# Patient Record
Sex: Male | Born: 1970 | Race: White | Hispanic: No | Marital: Married | Smoking: Current every day smoker
Health system: Southern US, Community
[De-identification: ages and names within clinical notes are randomized; demographics above are authoritative.]

## PROBLEM LIST (undated history)

## (undated) DIAGNOSIS — G4733 Obstructive sleep apnea (adult) (pediatric): Secondary | ICD-10-CM

## (undated) DIAGNOSIS — J449 Chronic obstructive pulmonary disease, unspecified: Secondary | ICD-10-CM

## (undated) DIAGNOSIS — E119 Type 2 diabetes mellitus without complications: Secondary | ICD-10-CM

## (undated) DIAGNOSIS — I10 Essential (primary) hypertension: Secondary | ICD-10-CM

## (undated) DIAGNOSIS — Z720 Tobacco use: Secondary | ICD-10-CM

## (undated) DIAGNOSIS — K7460 Unspecified cirrhosis of liver: Secondary | ICD-10-CM

## (undated) DIAGNOSIS — E7800 Pure hypercholesterolemia, unspecified: Secondary | ICD-10-CM

## (undated) DIAGNOSIS — I2510 Atherosclerotic heart disease of native coronary artery without angina pectoris: Secondary | ICD-10-CM

## (undated) HISTORY — PX: NOSE SURGERY: SHX723

## (undated) HISTORY — PX: KNEE SURGERY: SHX244

## (undated) HISTORY — PX: MANDIBLE SURGERY: SHX707

---

## 2010-07-18 ENCOUNTER — Emergency Department (HOSPITAL_COMMUNITY)
Admission: EM | Admit: 2010-07-18 | Discharge: 2010-07-18 | Disposition: A | Discharge status: Home / Self Care | Payer: No Typology Code available for payment source | Attending: Emergency Medicine | Admitting: Emergency Medicine

## 2010-07-18 DIAGNOSIS — Y92410 Unspecified street and highway as the place of occurrence of the external cause: Secondary | ICD-10-CM | POA: Insufficient documentation

## 2010-07-18 DIAGNOSIS — S335XXA Sprain of ligaments of lumbar spine, initial encounter: Secondary | ICD-10-CM | POA: Insufficient documentation

## 2010-07-18 DIAGNOSIS — IMO0002 Reserved for concepts with insufficient information to code with codable children: Secondary | ICD-10-CM | POA: Insufficient documentation

## 2010-07-18 DIAGNOSIS — E119 Type 2 diabetes mellitus without complications: Secondary | ICD-10-CM | POA: Insufficient documentation

## 2010-07-18 DIAGNOSIS — I10 Essential (primary) hypertension: Secondary | ICD-10-CM | POA: Insufficient documentation

## 2011-10-15 ENCOUNTER — Emergency Department: Payer: Self-pay | Admitting: *Deleted

## 2011-10-15 LAB — CBC
HCT: 45.4 % (ref 40.0–52.0)
MCH: 31.3 pg (ref 26.0–34.0)
MCHC: 34.1 g/dL (ref 32.0–36.0)
MCV: 92 fL (ref 80–100)
Platelet: 140 10*3/uL — ABNORMAL LOW (ref 150–440)
RBC: 4.95 10*6/uL (ref 4.40–5.90)
RDW: 14.4 % (ref 11.5–14.5)
WBC: 7.5 10*3/uL (ref 3.8–10.6)

## 2011-10-15 LAB — BASIC METABOLIC PANEL
Anion Gap: 7 (ref 7–16)
Chloride: 101 mmol/L (ref 98–107)
EGFR (African American): >60
EGFR (Non-African Amer.): >60
Glucose: 309 mg/dL — ABNORMAL HIGH (ref 65–99)
Osmolality: 283 (ref 275–301)
Potassium: 4 mmol/L (ref 3.5–5.1)

## 2011-10-15 LAB — CK TOTAL AND CKMB (NOT AT ARMC)
CK, Total: 103 U/L (ref 35–232)
CK-MB: 1 ng/mL (ref 0.5–3.6)

## 2014-02-13 LAB — COMPREHENSIVE METABOLIC PANEL
ANION GAP: 1 — AB (ref 7–16)
Albumin: 3.2 g/dL — ABNORMAL LOW (ref 3.4–5.0)
Alkaline Phosphatase: 70 U/L
BUN: 11 mg/dL (ref 7–18)
Bilirubin,Total: 0.6 mg/dL (ref 0.2–1.0)
CALCIUM: 8.7 mg/dL (ref 8.5–10.1)
CREATININE: 0.78 mg/dL (ref 0.60–1.30)
Chloride: 107 mmol/L (ref 98–107)
Co2: 26 mmol/L (ref 21–32)
EGFR (African American): >60
EGFR (Non-African Amer.): >60
Glucose: 213 mg/dL — ABNORMAL HIGH (ref 65–99)
Osmolality: 274 (ref 275–301)
Potassium: 3.6 mmol/L (ref 3.5–5.1)
SGOT(AST): 37 U/L (ref 15–37)
SGPT (ALT): 37 U/L
Sodium: 134 mmol/L — ABNORMAL LOW (ref 136–145)
TOTAL PROTEIN: 7.8 g/dL (ref 6.4–8.2)

## 2014-02-13 LAB — URINALYSIS, COMPLETE
BILIRUBIN, UR: NEGATIVE
Bacteria: NONE SEEN
Glucose,UR: >=500 mg/dL (ref 0–75)
Leukocyte Esterase: NEGATIVE
Nitrite: NEGATIVE
PROTEIN: NEGATIVE
Ph: 5 (ref 4.5–8.0)
RBC,UR: 1 /HPF (ref 0–5)
Specific Gravity: 1.023 (ref 1.003–1.030)
Squamous Epithelial: 1

## 2014-02-13 LAB — TROPONIN I

## 2014-02-13 LAB — CBC
HCT: 47.3 % (ref 40.0–52.0)
HGB: 16.1 g/dL (ref 13.0–18.0)
MCH: 31.2 pg (ref 26.0–34.0)
MCHC: 34.1 g/dL (ref 32.0–36.0)
MCV: 92 fL (ref 80–100)
Platelet: 187 10*3/uL (ref 150–440)
RBC: 5.17 10*6/uL (ref 4.40–5.90)
RDW: 14.2 % (ref 11.5–14.5)
WBC: 8.8 10*3/uL (ref 3.8–10.6)

## 2014-02-14 ENCOUNTER — Observation Stay: Payer: Self-pay | Admitting: Internal Medicine

## 2014-02-14 LAB — LIPID PANEL
Cholesterol: 143 mg/dL (ref 0–200)
HDL Cholesterol: 25 mg/dL — ABNORMAL LOW (ref 40–60)
Ldl Cholesterol, Calc: 71 mg/dL (ref 0–100)
Triglycerides: 237 mg/dL — ABNORMAL HIGH (ref 0–200)
VLDL Cholesterol, Calc: 47 mg/dL — ABNORMAL HIGH (ref 5–40)

## 2014-02-14 LAB — TSH: Thyroid Stimulating Horm: 1.13 u[IU]/mL

## 2014-02-14 LAB — CK-MB
CK-MB: 0.7 ng/mL (ref 0.5–3.6)
CK-MB: 0.8 ng/mL (ref 0.5–3.6)
CK-MB: <0.5 ng/mL — ABNORMAL LOW (ref 0.5–3.6)

## 2014-02-14 LAB — HEMOGLOBIN A1C: Hemoglobin A1C: 9 % — ABNORMAL HIGH (ref 4.2–6.3)

## 2014-02-14 LAB — TROPONIN I
Troponin-I: <0.02 ng/mL
Troponin-I: <0.02 ng/mL

## 2014-09-18 NOTE — H&P (Signed)
PATIENT NAME:  Jacob Holland, Jacob Holland MR#:  [id number] DATE OF BIRTH:  06/10/1970  DATE OF ADMISSION:  02/14/2014  REFERRING PHYSICIAN: Su Ley, MD  PRIMARY CARE DOCTOR: Nonfocal.   ADMISSION DIAGNOSIS: Chest pain.   HISTORY OF PRESENT ILLNESS: This is a 44 year old Caucasian male who presents to the Emergency Department with chest pain. The patient states that it began before he even started to get out of his car to walk around the mall. He states that the pain was a 20 on a severity scale of 1 to 10, but is now approximately 5 to 6 out of 10 in severity. It was associated with diaphoresis and nausea. This is not the first time the patient has had pain like this, but he was slightly concerned because it began to radiate into his jaw and left shoulder. He has known coronary artery disease, but due to the constant nature of the pain and waxing and waning severity, the Emergency Department called for observation.  REVIEW OF SYSTEMS:  CONSTITUTIONAL: The patient denies fever or weakness.  EYES: Denies blurred vision or inflammation.  EARS, NOSE AND THROAT: Denies tinnitus or difficulty swallowing.  RESPIRATORY: Denies cough, shortness of breath.  CARDIOVASCULAR: Admits to chest pain and orthopnea but denies palpitations.  GASTROINTESTINAL: The patient admits to nausea associated with the original pain episode, but currently denies nausea, vomiting, or diarrhea.  GENITOURINARY: Denies dysuria, increased frequency, or hesitancy.  ENDOCRINE: The patient denies polyuria or nocturia. HEMATOLOGIC AND LYMPHATIC: Denies easy bruising, bleeding.  INTEGUMENTARY: Denies rash or lesions.  MUSCULOSKELETAL: Denies myalgias but admits to chronic joint pain.  NEUROLOGIC: The patient denies weakness in his extremities but admits to numbness in his feet at times.  PSYCHIATRIC: The patient denies depression or suicidal ideation.   PAST MEDICAL HISTORY: Diabetes type 2, diabetic neuropathy, nonalcoholic  cirrhosis of the liver, hyperlipidemia, hypertension, migraine headaches, degenerative disk disease, arthritis, as well as obstructive sleep apnea.   PAST SURGICAL HISTORY: The patient has had a septoplasty, repair of a jaw fracture as well as a total left knee replacement.   FAMILY HISTORY: Significant for coronary artery disease in his father, who is deceased of this condition.   SOCIAL HISTORY: The patient used to be a smoker. He quit 3 years ago. He denies drinking or drugs. He is married.   MEDICATIONS: Patient takes metformin 500 mg 1 tab p.o. b.i.d. He also admits to taking blood pressure medicines but can only remember lisinopril. He does not recall the dose.   ALLERGIES: No known drug allergies.  PERTINENT LABORATORY RESULTS AND RADIOGRAPHIC FINDINGS: Serum glucose is 213, BUN 11, creatinine 0.78, sodium 134, potassium 3.6, serum albumin is 3.2, alkaline phosphatase 70, AST 37, ALT 37. Troponin is negative. White blood cell count as well as hemoglobin and hematocrit are normal. Urine is negative for infection. Chest x-ray shows no acute cardiopulmonary process.   PHYSICAL EXAMINATION:  VITAL SIGNS: Temperature is 99.1, pulse 82, respirations 19, blood pressure 119/54, pulse oximetry 94% on room air.  GENERAL: The patient is alert and oriented x 3, in no apparent distress.  HEENT: Normocephalic, atraumatic. Pupils equal, round, and reactive to light and accommodation. Extraocular movements are intact. Mucous membranes are moist.  NECK: Trachea is midline. No adenopathy.  CHEST: Symmetric and atraumatic.  CARDIOVASCULAR: Regular rate and rhythm. Normal S1, S2. No rubs, clicks, or murmurs appreciated.  LUNGS: Clear to auscultation bilaterally. Normal effort and excursion.  ABDOMEN: Positive bowel sounds. Soft, nontender, nondistended. No hepatosplenomegaly.  GENITOURINARY: Deferred.  MUSCULOSKELETAL: The patient moves all 4 extremities equally. Strength 5/5 in upper extremities  bilaterally.  SKIN: No rashes or lesions.  EXTREMITIES: No clubbing, cyanosis, or edema.  NEUROLOGIC: Cranial nerves II through XII are grossly intact.  PSYCHIATRIC: Mood is normal. Affect is congruent.   ASSESSMENT AND PLAN: This is a 44 year old male admitted for chest pain. 1.  Chest pain. The patient has known coronary artery disease but the pain is atypical in nature due to its duration. It sounds like he has had some unstable angina, which frankly is also not new. He also looked remarkably comfortable to still have 5 or 6 severity chest pain. His initial troponins were negative and he has had no EKG changes. Still we will continue to follow his biomarkers.  2.  Hypertension. The patient's blood pressure is controlled at present. He mentions that he did not take his medication today. Certainly he needs to be on lisinopril for renal protection due to his diabetes. He does not recall the dose right now and I will hold this medicine at present, but he likely needs to be started on a beta blocker if he is not already on one.  3.  Diabetes type 2. Continue the patient's home dose of metformin and add sliding scale insulin while the patient is hospitalized.  4.  Hyperlipidemia. Will add a statin to the patient's regimen.  5.  Deep vein thrombosis prophylaxis. Sequential compression devices.  6.  Gastrointestinal prophylaxis. Pantoprazole.   CODE STATUS: The patient is full code.  TIME SPENT ON ADMISSION ORDERS AND PATIENT CARE: Approximately 35 minutes.   ____________________________ Kelton PillarMichael S. Sheryle Hailiamond, MD msd:ST D: 02/14/2014 00:10:53 ET T: 02/14/2014 00:47:27 ET JOB#: [id number]  cc: Kelton PillarMichael S. Sheryle Hailiamond, MD, <Dictator> Kelton PillarMICHAEL S Jervis Trapani MD ELECTRONICALLY SIGNED 02/15/2014 0:17

## 2014-09-18 NOTE — Discharge Summary (Signed)
PATIENT NAME:  Jacob Holland, Jacob Holland MR#:  [id number] DATE OF BIRTH:  12/07/1970  DATE OF ADMISSION:  02/14/2014 DATE OF DISCHARGE:  02/14/2014  PRIMARY CARE PHYSICIAN: Nonlocal.   DISCHARGE DIAGNOSES: Atypical chest pain, hypertension, hyperlipidemia, diabetes, obstructive sleep apnea, obesity.   CONDITION: Stable.   CODE STATUS: Full code.   HOME MEDICATIONS: Please refer to the medication reconciliation list   ACTIVITY: As tolerated.   DIET: Low-sodium, low-fat, low-cholesterol, ADA diet.   FOLLOWUP CARE: Follow up with PCP and a local cardiologist as soon as possible within 1 week.   HOSPITAL COURSE: The patient is a 44 year old Caucasian male with history of CAD, hypertension, hyperlipidemia, diabetes who presented in the ED with chest pain yesterday. For detailed history and physical examination, please refer to the admission note dictated by Dr. Sheryle Hailiamond.  LABORATORY DATA: On admission date, the patient's laboratory data was unremarkable except hyperlipidemia. Hemoglobin A1c 9.0. The patient's troponin has been negative. Urinalysis is negative.   Chest x-ray: No acute cardiopulmonary process.   The patient was admitted for atypical chest pain. After admission, the patient got a telemonitor which is normal sinus rhythm. The patient's troponin level has been negative. The patient only has mild chest tenderness on palpation. The patient denies any other symptoms. Hypertension has been controlled. The patient said that he has a home medication, aspirin and the hypertension medication. He does not want any prescription. Since the patient is clinically stable, he will be discharged to home today. The patient's home is [place]. He is visiting here. He will go back to [place] today and follow up with his cardiologist. I discussed patient's discharge plan with the patient, the patient's wife, nurse.   TIME SPENT: About 36 minutes.    ____________________________ Shaune PollackQing Ledon Weihe,  MD qc:lt D: 02/14/2014 11:25:16 ET T: 02/14/2014 18:47:03 ET JOB#: [id number]  cc: Shaune PollackQing Advith Martine, MD, <Dictator> Shaune PollackQING Shanetra Blumenstock MD ELECTRONICALLY SIGNED 02/15/2014 12:04

## 2015-02-06 ENCOUNTER — Observation Stay
Admission: EM | Admit: 2015-02-06 | Discharge: 2015-02-07 | Disposition: A | Discharge status: Home / Self Care | Payer: PRIVATE HEALTH INSURANCE | Attending: Internal Medicine | Admitting: Internal Medicine

## 2015-02-06 ENCOUNTER — Emergency Department: Payer: PRIVATE HEALTH INSURANCE

## 2015-02-06 ENCOUNTER — Encounter: Payer: Self-pay | Admitting: Emergency Medicine

## 2015-02-06 ENCOUNTER — Other Ambulatory Visit: Payer: Self-pay

## 2015-02-06 DIAGNOSIS — E785 Hyperlipidemia, unspecified: Secondary | ICD-10-CM | POA: Diagnosis not present

## 2015-02-06 DIAGNOSIS — I2510 Atherosclerotic heart disease of native coronary artery without angina pectoris: Secondary | ICD-10-CM | POA: Diagnosis not present

## 2015-02-06 DIAGNOSIS — E119 Type 2 diabetes mellitus without complications: Secondary | ICD-10-CM | POA: Insufficient documentation

## 2015-02-06 DIAGNOSIS — I25111 Atherosclerotic heart disease of native coronary artery with angina pectoris with documented spasm: Secondary | ICD-10-CM | POA: Insufficient documentation

## 2015-02-06 DIAGNOSIS — J449 Chronic obstructive pulmonary disease, unspecified: Secondary | ICD-10-CM | POA: Insufficient documentation

## 2015-02-06 DIAGNOSIS — Z9114 Patient's other noncompliance with medication regimen: Secondary | ICD-10-CM | POA: Insufficient documentation

## 2015-02-06 DIAGNOSIS — R0789 Other chest pain: Secondary | ICD-10-CM | POA: Diagnosis not present

## 2015-02-06 DIAGNOSIS — K7460 Unspecified cirrhosis of liver: Secondary | ICD-10-CM | POA: Insufficient documentation

## 2015-02-06 DIAGNOSIS — I10 Essential (primary) hypertension: Secondary | ICD-10-CM | POA: Diagnosis not present

## 2015-02-06 DIAGNOSIS — K760 Fatty (change of) liver, not elsewhere classified: Secondary | ICD-10-CM | POA: Diagnosis not present

## 2015-02-06 DIAGNOSIS — R079 Chest pain, unspecified: Secondary | ICD-10-CM | POA: Diagnosis present

## 2015-02-06 DIAGNOSIS — R0602 Shortness of breath: Secondary | ICD-10-CM | POA: Diagnosis not present

## 2015-02-06 DIAGNOSIS — Z7982 Long term (current) use of aspirin: Secondary | ICD-10-CM | POA: Diagnosis not present

## 2015-02-06 DIAGNOSIS — F17210 Nicotine dependence, cigarettes, uncomplicated: Secondary | ICD-10-CM | POA: Insufficient documentation

## 2015-02-06 DIAGNOSIS — E1110 Type 2 diabetes mellitus with ketoacidosis without coma: Secondary | ICD-10-CM | POA: Insufficient documentation

## 2015-02-06 HISTORY — DX: Essential (primary) hypertension: I10

## 2015-02-06 HISTORY — DX: Type 2 diabetes mellitus without complications: E11.9

## 2015-02-06 HISTORY — DX: Pure hypercholesterolemia, unspecified: E78.00

## 2015-02-06 HISTORY — DX: Atherosclerotic heart disease of native coronary artery without angina pectoris: I25.10

## 2015-02-06 HISTORY — DX: Obstructive sleep apnea (adult) (pediatric): G47.33

## 2015-02-06 HISTORY — DX: Chronic obstructive pulmonary disease, unspecified: J44.9

## 2015-02-06 HISTORY — DX: Tobacco use: Z72.0

## 2015-02-06 HISTORY — DX: Unspecified cirrhosis of liver: K74.60

## 2015-02-06 HISTORY — DX: Morbid (severe) obesity due to excess calories: E66.01

## 2015-02-06 LAB — GLUCOSE, CAPILLARY
Glucose-Capillary: 161 mg/dL — ABNORMAL HIGH (ref 65–99)
Glucose-Capillary: 137 mg/dL — ABNORMAL HIGH (ref 65–99)

## 2015-02-06 LAB — CBC
HEMATOCRIT: 45.9 % (ref 40.0–52.0)
Hemoglobin: 15.9 g/dL (ref 13.0–18.0)
MCH: 31.5 pg (ref 26.0–34.0)
MCHC: 34.5 g/dL (ref 32.0–36.0)
MCV: 91.1 fL (ref 80.0–100.0)
PLATELETS: 164 10*3/uL (ref 150–440)
RBC: 5.04 MIL/uL (ref 4.40–5.90)
RDW: 14.5 % (ref 11.5–14.5)
WBC: 9.1 10*3/uL (ref 3.8–10.6)

## 2015-02-06 LAB — BASIC METABOLIC PANEL
Anion gap: 7 (ref 5–15)
BUN: 16 mg/dL (ref 6–20)
CHLORIDE: 106 mmol/L (ref 101–111)
CO2: 26 mmol/L (ref 22–32)
CREATININE: 0.73 mg/dL (ref 0.61–1.24)
Calcium: 8.9 mg/dL (ref 8.9–10.3)
GFR calc Af Amer: >60 mL/min (ref >60)
GFR calc non Af Amer: >60 mL/min (ref >60)
GLUCOSE: 242 mg/dL — AB (ref 65–99)
POTASSIUM: 3.6 mmol/L (ref 3.5–5.1)
SODIUM: 139 mmol/L (ref 135–145)

## 2015-02-06 LAB — TSH: TSH: 0.851 u[IU]/mL (ref 0.350–4.500)

## 2015-02-06 LAB — TROPONIN I
Troponin I: <0.03 ng/mL (ref <0.031)
Troponin I: <0.03 ng/mL (ref <0.031)

## 2015-02-06 MED ORDER — SODIUM CHLORIDE 0.9 % IJ SOLN
3.0000 mL | Freq: Two times a day (BID) | INTRAMUSCULAR | Status: DC
  Administered 2015-02-06: 3 mL via INTRAVENOUS

## 2015-02-06 MED ORDER — NICOTINE 10 MG IN INHA: 1.0000 | RESPIRATORY_TRACT | Status: DC | PRN

## 2015-02-06 MED ORDER — ASPIRIN 81 MG PO CHEW
324.0000 mg | CHEWABLE_TABLET | Freq: Once | ORAL | Status: AC
  Administered 2015-02-06: 324 mg via ORAL
  Filled 2015-02-06: qty 4

## 2015-02-06 MED ORDER — SODIUM CHLORIDE 0.9 % IJ SOLN: 3.0000 mL | INTRAMUSCULAR | Status: DC | PRN

## 2015-02-06 MED ORDER — MORPHINE SULFATE (PF) 2 MG/ML IV SOLN: 1.0000 mg | INTRAVENOUS | Status: DC | PRN

## 2015-02-06 MED ORDER — SODIUM CHLORIDE 0.9 % IV SOLN: 250.0000 mL | INTRAVENOUS | Status: DC | PRN

## 2015-02-06 MED ORDER — ONDANSETRON HCL 4 MG/2ML IJ SOLN: 4.0000 mg | Freq: Four times a day (QID) | INTRAMUSCULAR | Status: DC | PRN

## 2015-02-06 MED ORDER — ONDANSETRON HCL 4 MG PO TABS: 4.0000 mg | ORAL_TABLET | Freq: Four times a day (QID) | ORAL | Status: DC | PRN

## 2015-02-06 MED ORDER — ACETAMINOPHEN 650 MG RE SUPP
650.0000 mg | Freq: Four times a day (QID) | RECTAL | Status: DC | PRN
Start: 2015-02-06 — End: 2015-02-07

## 2015-02-06 MED ORDER — HYDROCODONE-ACETAMINOPHEN 5-325 MG PO TABS: 1.0000 | ORAL_TABLET | ORAL | Status: DC | PRN

## 2015-02-06 MED ORDER — MORPHINE SULFATE (PF) 4 MG/ML IV SOLN
6.0000 mg | Freq: Once | INTRAVENOUS | Status: AC
  Administered 2015-02-06: 6 mg via INTRAVENOUS
  Filled 2015-02-06: qty 2

## 2015-02-06 MED ORDER — ASPIRIN EC 325 MG PO TBEC
325.0000 mg | DELAYED_RELEASE_TABLET | Freq: Every day | ORAL | Status: DC
  Filled 2015-02-06: qty 1

## 2015-02-06 MED ORDER — NITROGLYCERIN 0.4 MG SL SUBL: 0.4000 mg | SUBLINGUAL_TABLET | SUBLINGUAL | Status: DC | PRN

## 2015-02-06 MED ORDER — ACETAMINOPHEN 325 MG PO TABS
650.0000 mg | ORAL_TABLET | Freq: Four times a day (QID) | ORAL | Status: DC | PRN
  Administered 2015-02-06 – 2015-02-07 (×2): 650 mg via ORAL
  Filled 2015-02-06 (×2): qty 2

## 2015-02-06 MED ORDER — ENOXAPARIN SODIUM 40 MG/0.4ML ~~LOC~~ SOLN
40.0000 mg | SUBCUTANEOUS | Status: DC
  Administered 2015-02-06: 40 mg via SUBCUTANEOUS
  Filled 2015-02-06: qty 0.4

## 2015-02-06 MED ORDER — INSULIN ASPART 100 UNIT/ML ~~LOC~~ SOLN
0.0000 [IU] | Freq: Three times a day (TID) | SUBCUTANEOUS | Status: DC
  Administered 2015-02-06 – 2015-02-07 (×2): 3 [IU] via SUBCUTANEOUS
  Filled 2015-02-06 (×2): qty 3

## 2015-02-06 NOTE — ED Provider Notes (Signed)
[facility] Emergency Department Provider Note REMINDER - THIS NOTE IS NOT A FINAL MEDICAL RECORD UNTIL IT IS SIGNED. UNTIL THEN, THE CONTENT BELOW MAY REFLECT INFORMATION FROM A DOCUMENTATION TEMPLATE, NOT THE ACTUAL PATIENT VISIT. ____________________________________________  Time seen: Approximately 12:36 PM  I have reviewed the triage vital signs and the nursing notes.   HISTORY  Chief Complaint Chest Pain    HPI Jacob Holland is a 44 y.o. male reports that yesterday he had chest pain with nausea causing him to vomit twice. [place] Spaniel this went away and he went home. Then this afternoon at approximately 11 he was driving his car and had sudden onset of severe chest pressure that radiated into his left arm with nausea and a feeling of shortness of breath. He reports that this is improved after about 20 minutes, but he still has a slight feeling of pressure over the left chest.  Patient reports a previous cardiac catheterization and was told he had partial blockages while in [place]. He denies any recent surgeries, denies any trauma, no leg swelling, no history of blood clots, he is a smoker.  He reports a family history of cardiac disease that caused a massive MI in his father in this early 29s.   Past Medical History  Diagnosis Date  . Hypertension   . Diabetes mellitus without complication   . COPD (chronic obstructive pulmonary disease)   . Hypercholesteremia   . Cirrhosis     There are no active problems to display for this patient.   Past Surgical History  Procedure Laterality Date  . Nose surgery    . Mandible surgery    . Knee surgery      No current outpatient prescriptions on file.  Allergies Review of patient's allergies indicates no known allergies.  History reviewed. No pertinent family history.  Social History Social History  Substance Use Topics  . Smoking status: Current Every Day Smoker  . Smokeless tobacco: None  .  Alcohol Use: No    Review of Systems Constitutional: No fever/chills Eyes: No visual changes. ENT: No sore throat. Cardiovascular: See history of present illness Respiratory: See history of present illness  Gastrointestinal: No abdominal pain.    No diarrhea.  No constipation. Genitourinary: Negative for dysuria. Musculoskeletal: Negative for back pain. Skin: Negative for rash. Neurological: Negative for headaches, focal weakness or numbness.  10-point ROS otherwise negative.  ____________________________________________   PHYSICAL EXAM:  VITAL SIGNS: ED Triage Vitals  Enc Vitals Group     BP 02/06/15 1133 138/78 mmHg     Pulse Rate 02/06/15 1131 86     Resp 02/06/15 1131 20     Temp 02/06/15 1131 98.1 F (36.7 C)     Temp Source 02/06/15 1131 Oral     SpO2 02/06/15 1131 96 %     Weight 02/06/15 1128 340 lb (154.223 kg)     Height 02/06/15 1128 6\' 6"  (1.981 m)     Head Cir --      Peak Flow --      Pain Score 02/06/15 1129 8     Pain Loc --      Pain Edu? --      Excl. in GC? --    Constitutional: Alert and oriented. Well appearing and in no acute distress. Patient is markedly overweight. He is amicable in no distress. Eyes: Conjunctivae are normal. PERRL. EOMI. Head: Atraumatic. Nose: No congestion/rhinnorhea. Mouth/Throat: Mucous membranes are moist.  Oropharynx non-erythematous. Neck: No stridor.  Cardiovascular: Normal rate, regular rhythm. Grossly normal heart sounds.  Good peripheral circulation. Respiratory: Normal respiratory effort.  No retractions. Lungs CTAB. Gastrointestinal: Soft and nontender. No distention. No abdominal bruits. No CVA tenderness. Musculoskeletal: No lower extremity tenderness nor edema.  No joint effusions. Neurologic:  Normal speech and language. No gross focal neurologic deficits are appreciated. Skin:  Skin is warm, dry and intact. No rash noted. Psychiatric: Mood and affect are normal. Speech and behavior are  normal.  ____________________________________________   LABS (all labs ordered are listed, but only abnormal results are displayed)  Labs Reviewed  BASIC METABOLIC PANEL - Abnormal; Notable for the following:    Glucose, Bld 242 (*)    All other components within normal limits  CBC  TROPONIN I   ____________________________________________  EKG  Reviewed and interpreted by me Normal sinus rhythm Ventricular rate 85 No T-wave abnormalities There is a Q wave noted in V2 suspicious for previous septal infarct, though cannot be conclusively drawn QTc is 440 QRS 80 ____________________________________________  RADIOLOGY  DG Chest 2 View (Final result) Result time: 02/06/15 12:03:37   Final result by Rad Results In Interface (02/06/15 12:03:37)   Narrative:   CLINICAL DATA: Chest pain. Nausea and vomiting.  EXAM: CHEST 2 VIEW  COMPARISON: None.  FINDINGS: The heart size and mediastinal contours are within normal limits. Both lungs are clear. The visualized skeletal structures are unremarkable.  IMPRESSION: No active cardiopulmonary disease.    ____________________________________________   PROCEDURES  Procedure(s) performed: None  Critical Care performed: No  ____________________________________________   INITIAL IMPRESSION / ASSESSMENT AND PLAN / ED COURSE  Pertinent labs & imaging results that were available during my care of the patient were reviewed by me and considered in my medical decision making (see chart for details).  Patient presents with concerning left-sided chest pain. EKG and first troponin did not demonstrate any acute ischemia, he is at significant risk for coronary disease based on his risk factors and strong family history. He reports a previous catheterization that did demonstrate some coronary disease, though report not available. I will admit the patient for chest pain observation. I discussed with Dr. Ronelle Nigh of the cardiology  service who is in agreement with the plan. Patient agreeable, treated with aspirin and morphine in the ER at this time. We'll repeat EKG should he develop any severe worsening of his pain or other new symptoms arise. Similarly no significant risk factors for pulm embolic disease site coronary. No symptoms to suggest coronary dissection or aortic dissection. ____________________________________________   FINAL CLINICAL IMPRESSION(S) / ED DIAGNOSES  Final diagnoses:  Chest pain, moderate coronary artery risk      Sharyn Creamer, MD 02/06/15 1245

## 2015-02-06 NOTE — ED Notes (Signed)
Patient had episode CP yesterday with NV then went away.  Patient was driving one hr ago and began with severe left chest pain that radiates to shoulder.  Got dizzy and has had SHOB.

## 2015-02-06 NOTE — ED Notes (Signed)
Patient transported to X-ray 

## 2015-02-06 NOTE — Progress Notes (Signed)
44yo wm admitted to room 237 via stretcher from ED with chest pain.  No distress on ra.  Cardiac monitor placed on pt, pt denies chest pain at this time.  Lungs diminished lower lobes bil.  SL rt fa flushes well.  Oriented to room and surroundings, POC reviewed with pt.  Wife at bedside.  CB in reach, SR up x 2.

## 2015-02-06 NOTE — H&P (Signed)
[facility] [place] at [facility]   PATIENT NAME: Jacob Holland    MR#:  [id number]  DATE OF BIRTH:  1970/07/10  DATE OF ADMISSION:  02/06/2015  PRIMARY CARE PHYSICIAN: No PCP Per Patient   REQUESTING/REFERRING PHYSICIAN:   CHIEF COMPLAINT:   Chief Complaint  Patient presents with  . Chest Pain    HISTORY OF PRESENT ILLNESS: Jacob Holland  is a 44 y.o. male with a known history of  hypertension, diabetes type 2, COPD, hyperlipidemia, liver cirrhosis due to fatty liver with history of chest pain in the past has had a cardiac catheterization he is not sure if it was one year ago or 2 years ago. With 25% occlusion of 2 blood vessels. He is also noncompliant with his medications. He states that when he takes his blood pressure medications blood pressure is actually high. He is also supposed be on diabetes medication but takes that on a regular basis  Who presents to the emergency room complaining of chest pain. He reports since yesterday yesterday he had chest pain with nausea causing him to vomit twice . 2 resolved on its own. Then this afternoon at approximately 11 he was driving his car and had sudden onset of severe chest pressure that radiated into his left arm with nausea and a feeling of shortness of breath. He reports that this is improved after about 20 minutes, but he still has a slight feeling of pressure over the left chest. Patient's cardiac enzymes and EKG are nonrevealing. The ER physician spoke to the on-call cardiologist who recommended patient be admitted for observation  PAST MEDICAL HISTORY:   Past Medical History  Diagnosis Date  . Hypertension   . Diabetes mellitus without complication   . COPD (chronic obstructive pulmonary disease)   . Hypercholesteremia   . Cirrhosis     PAST SURGICAL HISTORY:  Past Surgical History  Procedure Laterality Date  . Nose surgery    . Mandible surgery    . Knee surgery      SOCIAL HISTORY:  Social  History  Substance Use Topics  . Smoking status: Current Every Day Smoker  . Smokeless tobacco: Not on file  . Alcohol Use: No    FAMILY HISTORY: History reviewed. No pertinent family history.  DRUG ALLERGIES: No Known Allergies  REVIEW OF SYSTEMS:   CONSTITUTIONAL: No fever, fatigue or weakness.  EYES: No blurred or double vision.  EARS, NOSE, AND THROAT: No tinnitus or ear pain.  RESPIRATORY: No cough, shortness of breath, wheezing or hemoptysis.  CARDIOVASCULAR:  positivechest pain, orthopnea, edema.  GASTROINTESTINAL: No nausea, vomiting, diarrhea or abdominal pain.  GENITOURINARY: No dysuria, hematuria.  ENDOCRINE: No polyuria, nocturia,  HEMATOLOGY: No anemia, easy bruising or bleeding SKIN: No rash or lesion. MUSCULOSKELETAL: No joint pain or arthritis.   NEUROLOGIC: No tingling, numbness, weakness.  PSYCHIATRY: No anxiety or depression.   MEDICATIONS AT HOME:  Prior to Admission medications   Medication Sig Start Date End Date Taking? Authorizing Provider  Aspirin-Caffeine (BAYER BACK & BODY) 500-32.5 MG TABS Take 3 tablets by mouth every 4 (four) hours as needed (for pain.).   Yes Historical Provider, MD      PHYSICAL EXAMINATION:   VITAL SIGNS: Blood pressure 112/60, pulse 79, temperature 98.1 F (36.7 C), temperature source Oral, resp. rate 20, height  (1.981 m), weight 154.223 kg (340 lb), SpO2 92 %.  GENERAL:  44 y.o.-year-old patient lying in the bed with no acute distress.  Morbidly  OB EYES: Pupils equal, round, reactive to light and accommodation. No scleral icterus. Extraocular muscles intact.  HEENT: Head atraumatic, normocephalic. Oropharynx and nasopharynx clear.  NECK:  Supple, no jugular venous distention. No thyroid enlargement, no tenderness.  LUNGS: Normal breath sounds bilaterally, no wheezing, rales,rhonchi or crepitation. No use of accessory muscles of respiration.  CARDIOVASCULAR: S1, S2 normal. No murmurs, rubs, or gallops.  ABDOMEN:  Soft, nontender, nondistended. Bowel sounds present. No organomegaly or mass.  EXTREMITIES: No pedal edema, cyanosis, or clubbing.  NEUROLOGIC: Cranial nerves II through XII are intact. Muscle strength 5/5 in all extremities. Sensation intact. Gait not checked.  PSYCHIATRIC: The patient is alert and oriented x 3.  SKIN: No obvious rash, lesion, or ulcer.   LABORATORY PANEL:   CBC  Recent Labs Lab 02/06/15 1139  WBC 9.1  HGB 15.9  HCT 45.9  PLT 164  MCV 91.1  MCH 31.5  MCHC 34.5  RDW 14.5   ------------------------------------------------------------------------------------------------------------------  Chemistries   Recent Labs Lab 02/06/15 1139  NA 139  K 3.6  CL 106  CO2 26  GLUCOSE 242*  BUN 16  CREATININE 0.73  CALCIUM 8.9   ------------------------------------------------------------------------------------------------------------------ estimated creatinine clearance is 194.2 mL/min (by C-G formula based on Cr of 0.73). ------------------------------------------------------------------------------------------------------------------ No results for input(s): TSH, T4TOTAL, T3FREE, THYROIDAB in the last 72 hours.  Invalid input(s): FREET3   Coagulation profile No results for input(s): INR, PROTIME in the last 168 hours. ------------------------------------------------------------------------------------------------------------------- No results for input(s): DDIMER in the last 72 hours. -------------------------------------------------------------------------------------------------------------------  Cardiac Enzymes  Recent Labs Lab 02/06/15 1139  TROPONINI <0.03   ------------------------------------------------------------------------------------------------------------------ Invalid input(s): POCBNP  ---------------------------------------------------------------------------------------------------------------  Urinalysis No results found for:  COLORURINE, APPEARANCEUR, LABSPEC, PHURINE, GLUCOSEU, HGBUR, BILIRUBINUR, KETONESUR, PROTEINUR, UROBILINOGEN, NITRITE, LEUKOCYTESUR   RADIOLOGY: Dg Chest 2 View  02/06/2015   CLINICAL DATA:  Chest pain.  Nausea and vomiting.  EXAM: CHEST  2 VIEW  COMPARISON:  None.  FINDINGS: The heart size and mediastinal contours are within normal limits. Both lungs are clear. The visualized skeletal structures are unremarkable.  IMPRESSION: No active cardiopulmonary disease.   Electronically Signed   By: Signa Kell M.D.   On: 02/06/2015 12:03    EKG: Orders placed or performed during the hospital encounter of 02/06/15  . EKG 12-Lead  . EKG 12-Lead  . ED EKG within 10 minutes  . ED EKG within 10 minutes    IMPRESSION AND PLAN:  Patient is a 44 year old with chest pain  1. Chest pain: Place under observation serial cardiac enzymes, due to his weight he he will need 2 days of stress test. He does not seem interested in staying in the hospital for that. I will ask cardiology to evaluate the patient and give their opinion regarding what further needs to be done especially in light of him having a cardiac catheterization within the past 2 years. We'll cycle cardiac enzymes I will order stress test for now but this could be canceled if cardiology does not feel that this warranted. Aspirin nitroglycerin when necessary  2. Diabetes type 2: Noncompliant check a hemoglobin A1c patient supposed to be on metformin which I encouraged him to use  3. Hypertension not on any blood pressure medications his blood pressure is currently normal monitor his blood pressure  4. Hyperlipidemia: Again not on any treatment check a fasting lipid panel in the a.m.  5. Nicotine addiction smoking cessation provided for minutes spent nicotine replacement will be offered    All the records are reviewed and  case discussed with ED provider. Management plans discussed with the patient, family and they are in agreement.  CODE  STATUS:    Code Status Orders        Start     Ordered   02/06/15 1309  Full code   Continuous     02/06/15 1309       TOTAL TIME TAKING CARE OF THIS PATIENT:  55 minutes.    Auburn Bilberry M.D on 02/06/2015 at 1:27 PM  Between 7am to 6pm - Pager - [phone]  After 6pm go to [email or web address] - password [email or web address] [facility]  [facility]  Office  [phone]  CC: Primary care physician; No PCP Per Patient

## 2015-02-06 NOTE — Plan of Care (Signed)
Problem: Consults Goal: Tobacco Cessation referral if indicated Outcome: Progressing Smoking cessation given to pt

## 2015-02-07 ENCOUNTER — Other Ambulatory Visit: Payer: Self-pay

## 2015-02-07 ENCOUNTER — Encounter: Payer: Self-pay | Admitting: Nurse Practitioner

## 2015-02-07 DIAGNOSIS — R0789 Other chest pain: Secondary | ICD-10-CM

## 2015-02-07 DIAGNOSIS — R079 Chest pain, unspecified: Secondary | ICD-10-CM

## 2015-02-07 DIAGNOSIS — E1310 Other specified diabetes mellitus with ketoacidosis without coma: Secondary | ICD-10-CM

## 2015-02-07 DIAGNOSIS — I25111 Atherosclerotic heart disease of native coronary artery with angina pectoris with documented spasm: Secondary | ICD-10-CM | POA: Insufficient documentation

## 2015-02-07 DIAGNOSIS — E1110 Type 2 diabetes mellitus with ketoacidosis without coma: Secondary | ICD-10-CM | POA: Insufficient documentation

## 2015-02-07 LAB — HEMOGLOBIN A1C: HEMOGLOBIN A1C: 9.2 % — AB (ref 4.0–6.0)

## 2015-02-07 LAB — LIPID PANEL
Cholesterol: 155 mg/dL (ref 0–200)
HDL: 32 mg/dL — AB (ref >40)
LDL Cholesterol: 97 mg/dL (ref 0–99)
TRIGLYCERIDES: 132 mg/dL (ref <150)
Total CHOL/HDL Ratio: 4.8 RATIO
VLDL: 26 mg/dL (ref 0–40)

## 2015-02-07 LAB — BASIC METABOLIC PANEL
Anion gap: 7 (ref 5–15)
BUN: 14 mg/dL (ref 6–20)
CALCIUM: 8.5 mg/dL — AB (ref 8.9–10.3)
CHLORIDE: 107 mmol/L (ref 101–111)
CO2: 26 mmol/L (ref 22–32)
Creatinine, Ser: 0.6 mg/dL — ABNORMAL LOW (ref 0.61–1.24)
GFR calc Af Amer: >60 mL/min (ref >60)
GLUCOSE: 163 mg/dL — AB (ref 65–99)
POTASSIUM: 3.6 mmol/L (ref 3.5–5.1)
SODIUM: 140 mmol/L (ref 135–145)

## 2015-02-07 LAB — GLUCOSE, CAPILLARY: Glucose-Capillary: 190 mg/dL — ABNORMAL HIGH (ref 65–99)

## 2015-02-07 LAB — TROPONIN I: Troponin I: <0.03 ng/mL (ref <0.031)

## 2015-02-07 NOTE — Progress Notes (Signed)
Discharge instructions given to patient and wife. No medications. Patient is to follow up with PCP for home medications. Patient will follow up outpatient with cardiology. Stress test was not completed in hospital. Patient has no further questions. IV and tele discontinued. Education given on chest pain and diabetes.

## 2015-02-07 NOTE — Progress Notes (Signed)
     Jacob Holland was admitted to the [facility] on 02/06/2015 for an acute medical condition and is being Discharged on  02/07/2015 . He is doing well and should be able to return to work on 02/08/15. If symptoms were to return, advised to see PCP. So please excuse him from work for the above mentioned 2 Days.  Call Enid Baas  MD, [facility] at  [phone] with questions.  Enid Baas M.D on 02/07/2015,at 10:24 AM  [facility] [address] [place] [address]

## 2015-02-07 NOTE — Consult Note (Signed)
CARDIOLOGY CONSULT NOTE   Patient ID: Jacob Holland MRN: [id number], DOB/AGE: 1971/05/16   Admit date: 02/06/2015 Date of Consult: 02/07/2015   Primary Physician: No PCP Per Patient Primary Cardiologist: [facility]  Pt. Profile  44 year old male with a history of chest pain and reportedly nonobstructive coronary artery disease who presented to the ED yesterday secondary to recurrent chest pain.  Problem List  Past Medical History  Diagnosis Date  . Essential hypertension     not on meds  . Diabetes mellitus without complication     not on meds  . COPD (chronic obstructive pulmonary disease)   . Hypercholesteremia     a. Untreated.  . 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 Non-obstructive CAD     a. Three caths between ~ 2010 and 2015 - all in [place], [place] (Has seen Drs. Abbey Chatters, & Kathryne Sharper).  . Tobacco abuse     a. 20 pack year hx - ongoing (1ppd).  . Morbid obesity   . OSA (obstructive sleep apnea)     a. CPAP noncompliant.    Past Surgical History  Procedure Laterality Date  . Nose surgery    . Mandible surgery    . Knee surgery       Allergies  No Known Allergies  HPI   44 year old male with a prior history of hypertension, hyperlipidemia, diabetes mellitus, sleep apnea, and chest pain. He has had at least 3 diagnostic cardiac catheterizations, all in [place], dating back to 2010.he was told that he has 25% blockage in 2 vessels. Disease had not progressed by the time of his last catheterization in the spring of 2015. He does not generally experience chest discomfort. He is sedentary. 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 While at work on September 10, he had an episode of severe, focal, sharp, left sided c/p.  This lasted about 5 mins and then became more squeezing in nature.  This was associated with mild dyspnea followed by nausea and then vomiting. Following the vomiting episode,  he felt completely better and returned to work.  He had no recurrence of c/p the remainder of the day on the 10th but on 9/11, while driving to work, he developed recurrent, sharp, focal, left sided c/p.  Again, this lasted a few mins and then became more of a squeezing discomfort.  He decided to drive directly to [facility] ED.  Here, ECG was non-acute.  Trop was neg.  He was treated with MSO4 with complete relief of c/p.  He was observed and has not had any recurrent c/p.  He prefers to go home and f/u in [place].  Inpatient Medications  . aspirin EC  325 mg Oral Daily  . enoxaparin (LOVENOX) injection  40 mg Subcutaneous Q24H  . insulin aspart  0-15 Units Subcutaneous TID WC  . sodium chloride  3 mL Intravenous Q12H    Family History Family History  Problem Relation Age of Onset  . Other      No premature CAD.     Social History Social History   Social History  . Marital Status: Married    Spouse Name: N/A  . Number of Children: N/A  . Years of Education: N/A   Occupational History  . Not on file.   Social History Main Topics  . Smoking status: Current Every Day Smoker -- 1.00 packs/day for 20 years    Types: Cigarettes  .  Smokeless tobacco: Not on file  . Alcohol Use: No  . Drug Use: No  . Sexual Activity: Not on file   Other Topics Concern  . Not on file   Social History Narrative   Lives in [place], [place] with his wife.  No children.  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  Does not routinely exercise.     Review of Systems  General:  No chills, fever, night sweats or weight changes.  Cardiovascular:  +++ chest pain, no dyspnea on exertion, edema, orthopnea, palpitations, paroxysmal nocturnal dyspnea. Dermatological: No rash, lesions/masses Respiratory: No cough, dyspnea Urologic: No hematuria, dysuria Abdominal:   No nausea, vomiting, diarrhea, bright red blood per rectum, melena, or hematemesis Neurologic:  No visual changes, wkns, changes in mental  status. All other systems reviewed and are otherwise negative except as noted above.  Physical Exam  Blood pressure 123/65, pulse 72, temperature 98.1 F (36.7 C), temperature source Oral, resp. rate 20, height  (1.981 m), weight 342 lb 4.8 oz (155.266 kg), SpO2 91 %.  General: Pleasant, NAD Psych: Normal affect. Neuro: Alert and oriented X 3. Moves all extremities spontaneously. HEENT: Normal  Neck: Supple without bruits or JVD. Lungs:  Resp regular and unlabored, CTA. Heart: RRR no s3, s4, or murmurs. Abdomen: Soft, non-tender, non-distended, BS + x 4.  Extremities: No clubbing, cyanosis or edema. DP/PT/Radials 2+ and equal bilaterally.  Labs   Recent Labs  02/06/15 1139 02/06/15 2048 02/07/15 0506  TROPONINI <0.03 <0.03 <0.03   Lab Results  Component Value Date   WBC 9.1 02/06/2015   HGB 15.9 02/06/2015   HCT 45.9 02/06/2015   MCV 91.1 02/06/2015   PLT 164 02/06/2015     Recent Labs Lab 02/07/15 0506  NA 140  K 3.6  CL 107  CO2 26  BUN 14  CREATININE 0.60*  CALCIUM 8.5*  GLUCOSE 163*   Lab Results  Component Value Date   CHOL 155 02/07/2015   HDL 32* 02/07/2015   LDLCALC 97 02/07/2015   TRIG 132 02/07/2015   Radiology/Studies  Dg Chest 2 View  02/06/2015   CLINICAL DATA:  Chest pain.  Nausea and vomiting.  EXAM: CHEST  2 VIEW  COMPARISON:  None.  FINDINGS: The heart size and mediastinal contours are within normal limits. Both lungs are clear. The visualized skeletal structures are unremarkable.  IMPRESSION: No active cardiopulmonary disease.   Electronically Signed   By: Signa Kell M.D.   On: 02/06/2015 12:03    ECG  RSR, 86, delayed R progression.  No acute ST/T changes.  ASSESSMENT AND PLAN  1.  Left-sided chest pain Pt presented on 9/11 following his second episode in 2 days of sharp, focal, left-sided c/p followed by a more squeezing-like discomfort.  The first episode on 9/10, was associated with nausea and vomiting. 3 prior caths  in the last 6 years - all in [place], [place] - all showing minimal non-obs dzs per pt/wife. Troponins have been negative. ECG is non-acute. He has not had any recurrence of c/p. -Recommend discharge with outpt cardiology f/u in [place], [place]. -Add PPI.  2.  Essential HTN BP stable.  Does not routinely take meds @ home.  3.  DM II Says he's on something @ home - probably metformin.  4.  HL LDL 97.  He is not on statin but in light of DM he ideally would be.  -Defer statin initiation to outpt PCP given h/o fatty liver/cirrhosis.  5.  Morbid Obesity Really needs aggressive counseling re: wt loss, caloric restriction, and increasing activity.  6.  Tob Abuse Advised to stop smoking. He is not interested in quitting @ this time.  7.  OSA He was prev dx but doesn't use CPAP b/c there's something wrong with his machine. -Advised to get this corrected and use CPAP as Rx.   Signed, Nicolasa Ducking, NP 02/07/2015, 9:16 AM

## 2015-02-07 NOTE — Discharge Summary (Signed)
[facility] - [place] at [facility]   PATIENT NAME: Jacob Holland    MR#:  [id number]  DATE OF BIRTH:  1971-02-03  DATE OF ADMISSION:  02/06/2015 ADMITTING PHYSICIAN: Auburn Bilberry, MD  DATE OF DISCHARGE: 02/07/2015  PRIMARY CARE PHYSICIAN: No PCP Per Patient    ADMISSION DIAGNOSIS:  Chest pain, moderate coronary artery risk [R07.9] Chest pain [R07.9]  DISCHARGE DIAGNOSIS:  Principal Problem:   Musculoskeletal chest pain Active Problems:   Chest pain   SECONDARY DIAGNOSIS:   Past Medical History  Diagnosis Date  . Hypertension   . Diabetes mellitus without complication   . COPD (chronic obstructive pulmonary disease)   . Hypercholesteremia   . Cirrhosis   . Non-obstructive CAD     a. 2014 Cath: Reported non-obs 2 vessel dzs.    HOSPITAL COURSE:   44 year old obese male with cirrhosis, diabetes, COPD and ongoing smoking presents with chest pain.  #1 chest pain-atypical chest pain. Likely musculoskeletal in origin. -Patient does lift heavy boxes. Cardiac enzymes negative. Not interested in getting stress test and in the hospital. -Seen by cardiology and deemed as low risk at this time. -Previous cardiac catheterizations 3 times at [facility] negative. Follow-up with cardiology as outpatient -Smoking cessation advised and advised to be compliant with his medications.  #2 diabetes mellitus-not on any medications at this time. A1c is pending -Recommend outpatient follow-up. Sugars are borderline elevated in the hospital  #3 hyperlipidemia-LDL of 97. No significant coronary artery disease. Hold off on starting statin at this time. Dietary modifications recommended.  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 Recommended outpatient follow-up.  #5 tobacco use disorder counseled at discharge.  Discharge today  DISCHARGE CONDITIONS:   Stable  CONSULTS OBTAINED:  Treatment Team:  Auburn Bilberry, MD Laurey Morale, MD  DRUG  ALLERGIES:  No Known Allergies  DISCHARGE MEDICATIONS:   Current Discharge Medication List    STOP taking these medications     Aspirin-Caffeine (BAYER BACK & BODY) 500-32.5 MG TABS          DISCHARGE INSTRUCTIONS:   1. PCP follow-up in 1 week 2. Cardiology follow-up at [facility] in 1-2 weeks  If you experience worsening of your admission symptoms, develop shortness of breath, life threatening emergency, suicidal or homicidal thoughts you must seek medical attention immediately by calling 911 or calling your MD immediately  if symptoms less severe.  You Must read complete instructions/literature along with all the possible adverse reactions/side effects for all the Medicines you take and that have been prescribed to you. Take any new Medicines after you have completely understood and accept all the possible adverse reactions/side effects.   Please note  You were cared for by a hospitalist during your hospital stay. If you have any questions about your discharge medications or the care you received while you were in the hospital after you are discharged, you can call the unit and asked to speak with the hospitalist on call if the hospitalist that took care of you is not available. Once you are discharged, your primary care physician will handle any further medical issues. Please note that NO REFILLS for any discharge medications will be authorized once you are discharged, as it is imperative that you return to your primary care physician (or establish a relationship with a primary care physician if you do not have one) for your aftercare needs so that they can reassess your need for medications and monitor your lab values.    Today   CHIEF  COMPLAINT:   Chief Complaint  Patient presents with  . Chest Pain    VITAL SIGNS:  Blood pressure 123/65, pulse 72, temperature 98.1 F (36.7 C), temperature source Oral, resp. rate 20, height 6\' 6"  (1.981 m), weight 155.266 kg (342 lb 4.8  oz), SpO2 91 %.  I/O:   Intake/Output Summary (Last 24 hours) at 02/07/15 0908 Last data filed at 02/07/15 0647  Gross per 24 hour  Intake    483 ml  Output    200 ml  Net    283 ml    PHYSICAL EXAMINATION:   Physical Exam  GENERAL: 44 y.o.-year-old obese patient lying in the bed with no acute distress.  EYES: Pupils equal, round, reactive to light and accommodation. No scleral icterus. Extraocular muscles intact.  HEENT: Head atraumatic, normocephalic. Oropharynx and nasopharynx clear.  NECK: Supple, no jugular venous distention. No thyroid enlargement, no tenderness.  LUNGS: Normal breath sounds bilaterally, no wheezing, rales,rhonchi or crepitation. No use of accessory muscles of respiration.  CARDIOVASCULAR: S1, S2 normal. No murmurs, rubs, or gallops.  No tenderness on palpation.  ABDOMEN: Soft, nontender, nondistended. Bowel sounds present. No organomegaly or mass.  EXTREMITIES: No pedal edema, cyanosis, or clubbing.  NEUROLOGIC: Cranial nerves II through XII are intact. Muscle strength 5/5 in all extremities. Sensation intact. Gait not checked.  PSYCHIATRIC: The patient is alert and oriented x 3.  SKIN: No obvious rash, lesion, or ulcer.   DATA REVIEW:   CBC  Recent Labs Lab 02/06/15 1139  WBC 9.1  HGB 15.9  HCT 45.9  PLT 164    Chemistries   Recent Labs Lab 02/07/15 0506  NA 140  K 3.6  CL 107  CO2 26  GLUCOSE 163*  BUN 14  CREATININE 0.60*  CALCIUM 8.5*    Cardiac Enzymes  Recent Labs Lab 02/07/15 0506  TROPONINI <0.03    Microbiology Results  No results found for this or any previous visit.  RADIOLOGY:  Dg Chest 2 View  02/06/2015   CLINICAL DATA:  Chest pain.  Nausea and vomiting.  EXAM: CHEST  2 VIEW  COMPARISON:  None.  FINDINGS: The heart size and mediastinal contours are within normal limits. Both lungs are clear. The visualized skeletal structures are unremarkable.  IMPRESSION: No active cardiopulmonary disease.    Electronically Signed   By: Signa Kell M.D.   On: 02/06/2015 12:03    EKG:   Orders placed or performed during the hospital encounter of 02/06/15  . EKG 12-Lead  . EKG 12-Lead  . ED EKG within 10 minutes  . ED EKG within 10 minutes      Management plans discussed with the patient, family and they are in agreement.  CODE STATUS:     Code Status Orders        Start     Ordered   02/06/15 1309  Full code   Continuous     02/06/15 1309      TOTAL TIME TAKING CARE OF THIS PATIENT:  36 minutes   Tashay Bozich M.D on 02/07/2015 at 9:08 AM  Between 7am to 6pm - Pager - [phone]  After 6pm go to [email or web address] - password [email or web address] [facility]  [place]  Office  [phone]  CC: Primary care physician; No PCP Per Patient

## 2015-02-07 NOTE — Progress Notes (Signed)
[facility] - [place] at [facility]   PATIENT NAME: Jacob Holland    MR#:  [id number]  DATE OF BIRTH:  07-15-1970  SUBJECTIVE:  CHIEF COMPLAINT:   Chief Complaint  Patient presents with  . Chest Pain   - Admitted for atypical chest pain. Completely resolved now. -wants to get follow-up as outpatient, previous cardiac catheterizations negative.   REVIEW OF SYSTEMS:  Review of Systems  Constitutional: Negative for fever and chills.  HENT: Negative for ear discharge, ear pain and tinnitus.   Eyes: Negative for blurred vision and double vision.  Respiratory: Negative for cough, shortness of breath and wheezing.   Cardiovascular: Negative for chest pain and palpitations.  Gastrointestinal: Negative for nausea, vomiting, abdominal pain, diarrhea and constipation.  Genitourinary: Negative for dysuria.  Neurological: Positive for headaches. Negative for dizziness and seizures.    DRUG ALLERGIES:  No Known Allergies  VITALS:  Blood pressure 123/65, pulse 72, temperature 98.1 F (36.7 C), temperature source Oral, resp. rate 20, height 6\' 6"  (1.981 m), weight 155.266 kg (342 lb 4.8 oz), SpO2 91 %.  PHYSICAL EXAMINATION:  Physical Exam  GENERAL:  44 y.o.-year-old obese patient lying in the bed with no acute distress.  EYES: Pupils equal, round, reactive to light and accommodation. No scleral icterus. Extraocular muscles intact.  HEENT: Head atraumatic, normocephalic. Oropharynx and nasopharynx clear.  NECK:  Supple, no jugular venous distention. No thyroid enlargement, no tenderness.  LUNGS: Normal breath sounds bilaterally, no wheezing, rales,rhonchi or crepitation. No use of accessory muscles of respiration.  CARDIOVASCULAR: S1, S2 normal. No murmurs, rubs, or gallops.  No tenderness on palpation.  ABDOMEN: Soft, nontender, nondistended. Bowel sounds present. No organomegaly or mass.  EXTREMITIES: No pedal edema, cyanosis, or clubbing.  NEUROLOGIC:  Cranial nerves II through XII are intact. Muscle strength 5/5 in all extremities. Sensation intact. Gait not checked.  PSYCHIATRIC: The patient is alert and oriented x 3.  SKIN: No obvious rash, lesion, or ulcer.    LABORATORY PANEL:   CBC  Recent Labs Lab 02/06/15 1139  WBC 9.1  HGB 15.9  HCT 45.9  PLT 164   ------------------------------------------------------------------------------------------------------------------  Chemistries   Recent Labs Lab 02/07/15 0506  NA 140  K 3.6  CL 107  CO2 26  GLUCOSE 163*  BUN 14  CREATININE 0.60*  CALCIUM 8.5*   ------------------------------------------------------------------------------------------------------------------  Cardiac Enzymes  Recent Labs Lab 02/07/15 0506  TROPONINI <0.03   ------------------------------------------------------------------------------------------------------------------  RADIOLOGY:  Dg Chest 2 View  02/06/2015   CLINICAL DATA:  Chest pain.  Nausea and vomiting.  EXAM: CHEST  2 VIEW  COMPARISON:  None.  FINDINGS: The heart size and mediastinal contours are within normal limits. Both lungs are clear. The visualized skeletal structures are unremarkable.  IMPRESSION: No active cardiopulmonary disease.   Electronically Signed   By: Signa Kell M.D.   On: 02/06/2015 12:03    EKG:   Orders placed or performed during the hospital encounter of 02/06/15  . EKG 12-Lead  . EKG 12-Lead  . ED EKG within 10 minutes  . ED EKG within 10 minutes    ASSESSMENT AND PLAN:    44 year old obese male with cirrhosis, diabetes, COPD and ongoing smoking presents with chest pain.  #1 chest pain-atypical chest pain. Likely musculoskeletal in origin. -Patient does lift heavy boxes. Cardiac enzymes negative. Not interested in getting stress test and in the hospital. -Seen by cardiology and deemed as low risk at this time. -Previous cardiac catheterizations 3 times at  [place] negative. Follow-up with  cardiology as outpatient -Smoking cessation advised and advised to be compliant with his medications.  #2 diabetes mellitus-not on any medications at this time. A1c is pending -Recommend outpatient follow-up. Sugars are borderline elevated in the hospital  #3 hyperlipidemia-LDL of 97. No significant coronary artery disease. Hold off on starting statin at this time. Dietary modifications recommended.  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 Recommended outpatient follow-up.  #5 tobacco use disorder counseled at discharge.  Discharge today.  All the records are reviewed and case discussed with Care Management/Social Workerr. Management plans discussed with the patient, family and they are in agreement.  CODE STATUS: FULL CODE  TOTAL TIME TAKING CARE OF THIS PATIENT: 36 minutes.   POSSIBLE D/C TODAY, DEPENDING ON CLINICAL CONDITION.   Enid Baas M.D on 02/07/2015 at 9:00 AM  Between 7am to 6pm - Pager - [phone]  After 6pm go to [email or web address] - password [email or web address] [facility]  [place] [facility]  Office  [phone]  CC: Primary care physician; No PCP Per Patient

## 2015-02-08 ENCOUNTER — Other Ambulatory Visit: Payer: Self-pay

## 2015-11-20 IMAGING — CR DG CHEST 2V
1 series · 2 of 2 positions shown · non-contrast
Comparison: None.

CLINICAL DATA: Chest pain.  Nausea and vomiting.

EXAM:
CHEST  2 VIEW

[Series 1: dg chest 2 view · 0.14mm/px · 2 of 2 slices shown]
[im 1/2]
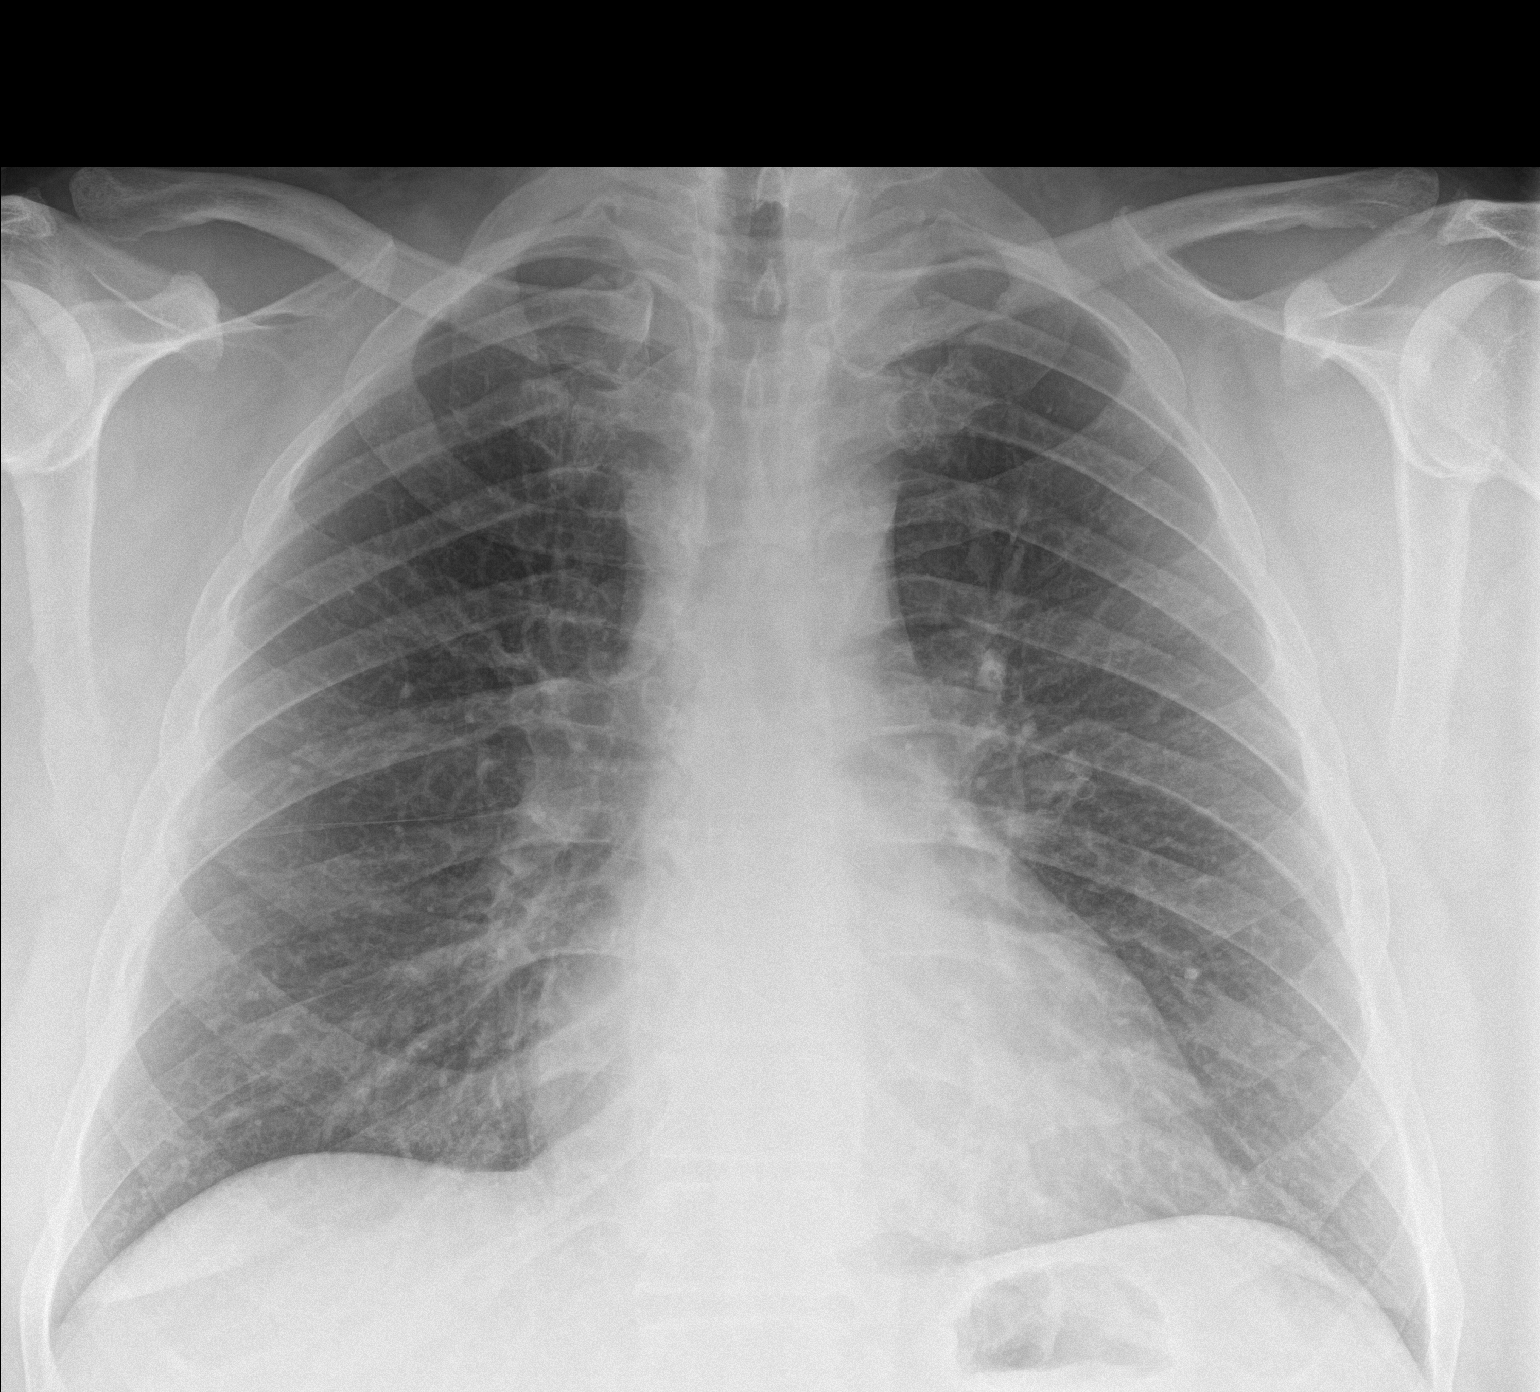
[im 2/2]
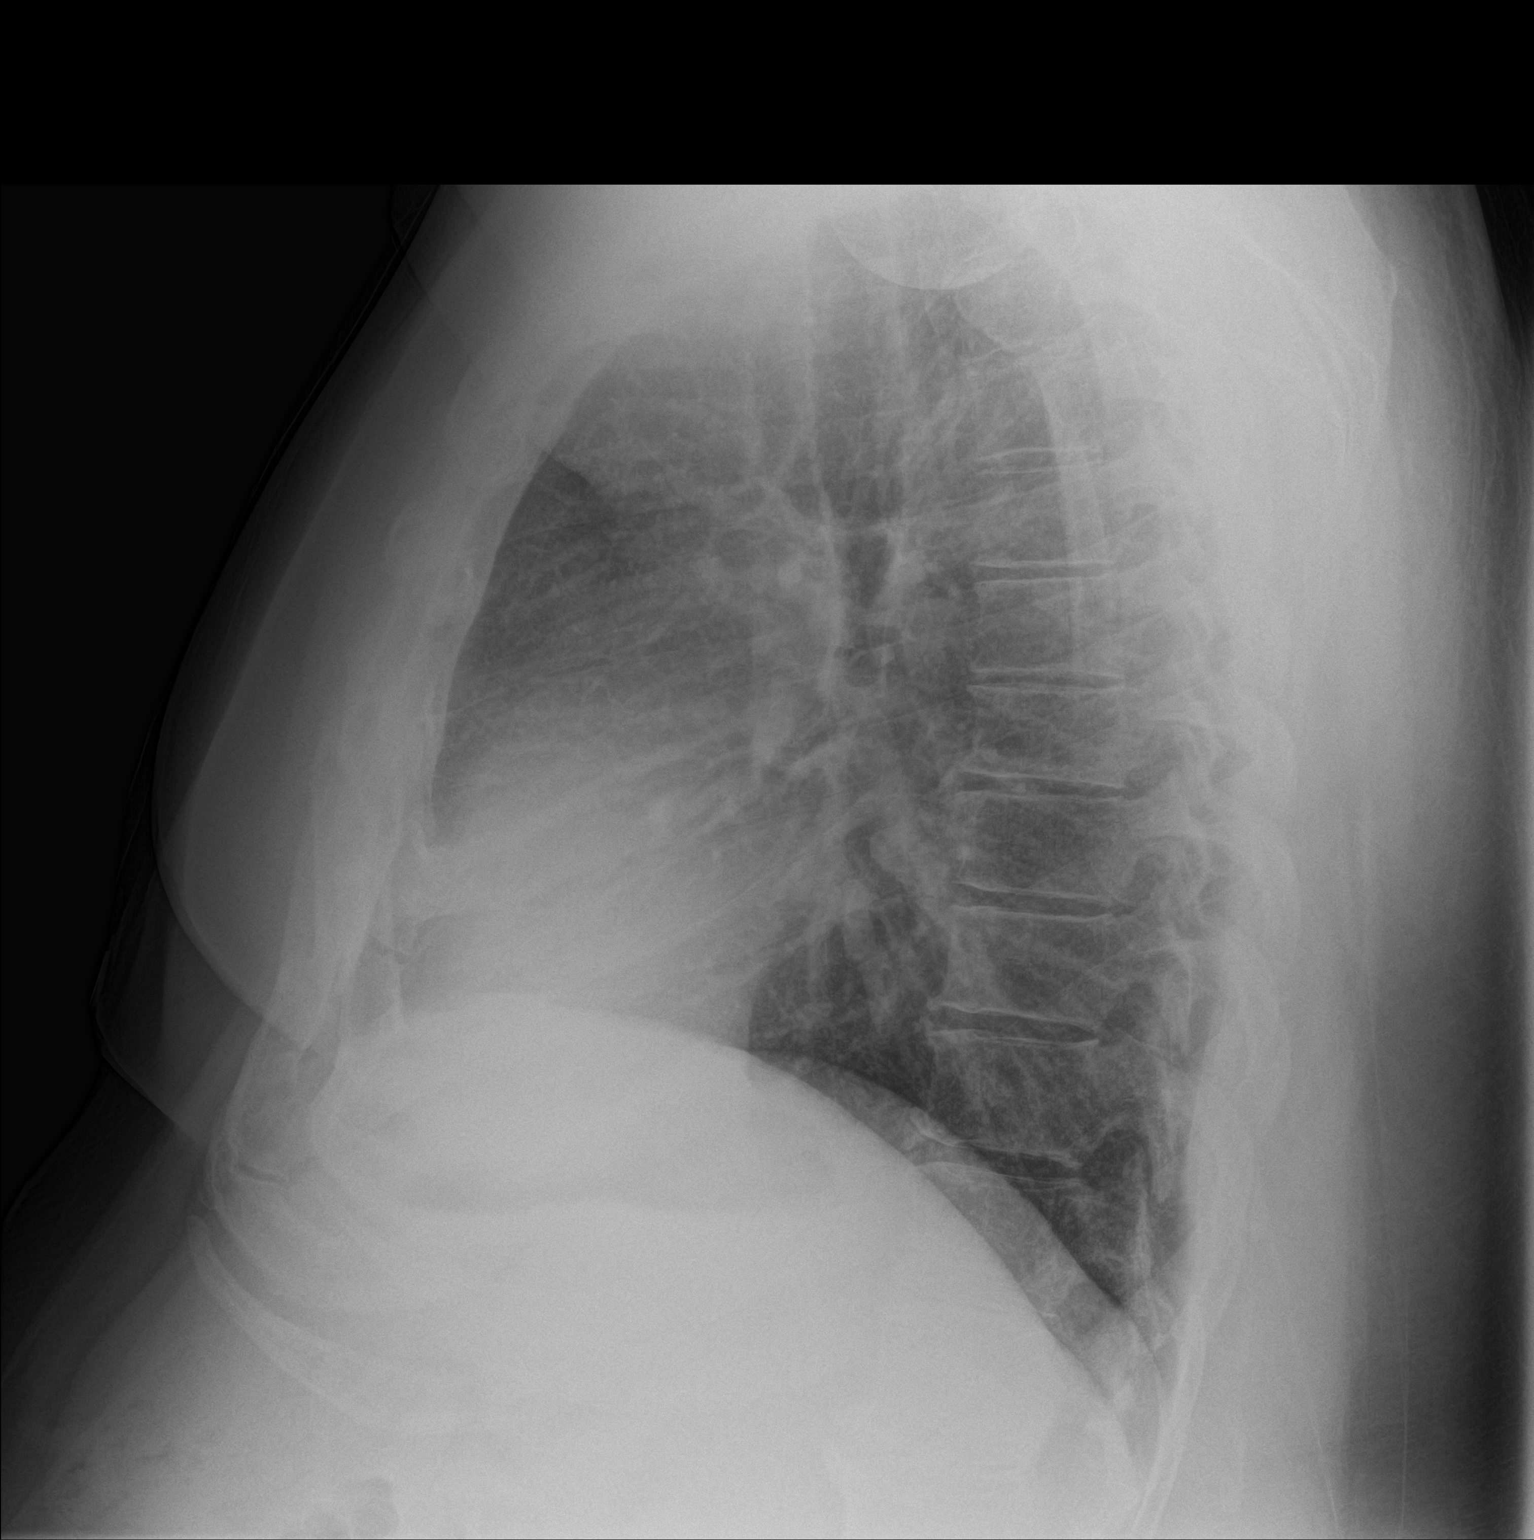

[2 of 2 positions shown; findings below may reference images not displayed]

FINDINGS: The heart size and mediastinal contours are within normal limits.
Both lungs are clear. The visualized skeletal structures are
unremarkable.
IMPRESSION: No active cardiopulmonary disease.

## 2019-01-23 ENCOUNTER — Observation Stay: Admission: AD | Admit: 2019-01-23 | Payer: PRIVATE HEALTH INSURANCE | Admitting: Internal Medicine
